# Patient Record
Sex: Female | Born: 1965 | Race: White | Hispanic: No | Marital: Married
Health system: Southern US, Community
[De-identification: ages and names within clinical notes are randomized; demographics above are authoritative.]

## PROBLEM LIST (undated history)

## (undated) DIAGNOSIS — F319 Bipolar disorder, unspecified: Secondary | ICD-10-CM

## (undated) HISTORY — PX: KNEE SURGERY: SHX244

## (undated) HISTORY — PX: HERNIA REPAIR: SHX51

## (undated) HISTORY — PX: CHOLECYSTECTOMY: SHX55

## (undated) HISTORY — PX: TONSILLECTOMY: SUR1361

## (undated) HISTORY — PX: ABDOMINAL HYSTERECTOMY: SHX81

---

## 2008-05-14 ENCOUNTER — Emergency Department (HOSPITAL_BASED_OUTPATIENT_CLINIC_OR_DEPARTMENT_OTHER): Admission: EM | Admit: 2008-05-14 | Discharge: 2008-05-14 | Payer: Self-pay | Admitting: Emergency Medicine

## 2008-08-16 ENCOUNTER — Emergency Department (HOSPITAL_BASED_OUTPATIENT_CLINIC_OR_DEPARTMENT_OTHER): Admission: EM | Admit: 2008-08-16 | Discharge: 2008-08-16 | Payer: Self-pay | Admitting: Emergency Medicine

## 2008-08-16 ENCOUNTER — Ambulatory Visit: Payer: Self-pay | Admitting: Diagnostic Radiology

## 2008-10-14 ENCOUNTER — Ambulatory Visit: Payer: Self-pay | Admitting: Diagnostic Radiology

## 2008-10-14 ENCOUNTER — Emergency Department (HOSPITAL_BASED_OUTPATIENT_CLINIC_OR_DEPARTMENT_OTHER): Admission: EM | Admit: 2008-10-14 | Discharge: 2008-10-14 | Payer: Self-pay | Admitting: Emergency Medicine

## 2008-11-08 ENCOUNTER — Emergency Department (HOSPITAL_BASED_OUTPATIENT_CLINIC_OR_DEPARTMENT_OTHER): Admission: EM | Admit: 2008-11-08 | Discharge: 2008-11-08 | Payer: Self-pay | Admitting: Emergency Medicine

## 2008-11-08 ENCOUNTER — Ambulatory Visit: Payer: Self-pay | Admitting: Diagnostic Radiology

## 2010-07-04 LAB — URINALYSIS, ROUTINE W REFLEX MICROSCOPIC
Ketones, ur: NEGATIVE mg/dL
Nitrite: NEGATIVE
Specific Gravity, Urine: 1.023 (ref 1.005–1.030)
pH: 5.5 (ref 5.0–8.0)

## 2016-07-26 ENCOUNTER — Emergency Department (HOSPITAL_BASED_OUTPATIENT_CLINIC_OR_DEPARTMENT_OTHER): Payer: Self-pay

## 2016-07-26 ENCOUNTER — Emergency Department (HOSPITAL_BASED_OUTPATIENT_CLINIC_OR_DEPARTMENT_OTHER)
Admission: EM | Admit: 2016-07-26 | Discharge: 2016-07-26 | Disposition: A | Discharge status: Home / Self Care | Payer: Self-pay | Attending: Emergency Medicine | Admitting: Emergency Medicine

## 2016-07-26 ENCOUNTER — Encounter (HOSPITAL_BASED_OUTPATIENT_CLINIC_OR_DEPARTMENT_OTHER): Payer: Self-pay | Admitting: *Deleted

## 2016-07-26 DIAGNOSIS — M5442 Lumbago with sciatica, left side: Secondary | ICD-10-CM

## 2016-07-26 DIAGNOSIS — M545 Low back pain: Secondary | ICD-10-CM | POA: Insufficient documentation

## 2016-07-26 DIAGNOSIS — M5432 Sciatica, left side: Secondary | ICD-10-CM | POA: Insufficient documentation

## 2016-07-26 DIAGNOSIS — T148XXA Other injury of unspecified body region, initial encounter: Secondary | ICD-10-CM

## 2016-07-26 HISTORY — DX: Bipolar disorder, unspecified: F31.9

## 2016-07-26 MED ORDER — HYDROMORPHONE HCL 1 MG/ML IJ SOLN
1.0000 mg | Freq: Once | INTRAMUSCULAR | Status: AC
  Administered 2016-07-26: 1 mg via INTRAMUSCULAR
  Filled 2016-07-26: qty 1

## 2016-07-26 MED ORDER — METHOCARBAMOL 500 MG PO TABS: 500.0000 mg | ORAL_TABLET | Freq: Two times a day (BID) | ORAL | 0 refills | Status: AC

## 2016-07-26 MED ORDER — PREDNISONE 50 MG PO TABS
60.0000 mg | ORAL_TABLET | Freq: Once | ORAL | Status: AC
  Administered 2016-07-26: 60 mg via ORAL
  Filled 2016-07-26: qty 1

## 2016-07-26 MED ORDER — HYDROCODONE-ACETAMINOPHEN 5-325 MG PO TABS: 1.0000 | ORAL_TABLET | ORAL | 0 refills | Status: AC | PRN

## 2016-07-26 MED ORDER — DIAZEPAM 5 MG PO TABS
5.0000 mg | ORAL_TABLET | Freq: Once | ORAL | Status: AC
  Administered 2016-07-26: 5 mg via ORAL
  Filled 2016-07-26: qty 1

## 2016-07-26 MED ORDER — OXYCODONE-ACETAMINOPHEN 5-325 MG PO TABS
1.0000 | ORAL_TABLET | Freq: Once | ORAL | Status: AC
  Administered 2016-07-26: 1 via ORAL
  Filled 2016-07-26: qty 1

## 2016-07-26 MED ORDER — PREDNISONE 20 MG PO TABS: 40.0000 mg | ORAL_TABLET | Freq: Every day | ORAL | 0 refills | Status: AC

## 2016-07-26 NOTE — ED Triage Notes (Signed)
Patient is alert and oriented x4.  She is complaining of lower back pain after attempting to pick up her granddaughter.  Patient states that she felt pop in her lower back.  Currently she rates her pain 10 of 10.

## 2016-07-26 NOTE — ED Notes (Signed)
ED Provider at bedside. 

## 2016-07-26 NOTE — Discharge Instructions (Signed)
Take your medications as prescribed and recommend refrain from doing any heavy lifting, repetitive movements or squatting that exacerbates her symptoms for the next few days. Follow-up with your primary care provider within the next 4-5 days for follow-up evaluation of your pain has not improved. Please return to the Emergency Department if symptoms worsen or new onset of fever, numbness, tingling, groin anesthesia, loss of bowel or bladder, weakness, chest pain, difficulty breathing, abdominal pain.

## 2016-07-26 NOTE — ED Notes (Signed)
Patient ambulated in hall slow but steady gait, says she is sore but ok.

## 2016-07-26 NOTE — ED Provider Notes (Signed)
[facility] Provider Note   CSN: [id number] Arrival date & time: 07/26/16  1604  By signing my name below, I, [name], attest that this documentation has been prepared under the direction and in the presence of Melburn Hake, PA-C. Electronically Signed: [name], Scribe. 07/26/16. 4:30 PM.  History   Chief Complaint Chief Complaint  Patient presents with  . Back Pain    HPI Tamara Torres is a 51 y.o. female with h/o scoliosis, degenerative disk disease who presents to the Emergency Department complaining of sharp, stabbing, aching, 10/10 severity back pain s/p picking up granddaughter, who weighs 33 lbs earlier today. Pt was standing, bent over, and then attempted to lift up her granddaughter and describes feeling a pop in her left lower back. The pain radiates across her lower back and intermittently down both legs. Pt did not take any pain mediation PTA. Pt has associated intermittent tingling in her toes. Pt is able to ambulate with assistance but reports pain. 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 Pt denies having chemo/radiation. Reports no hx of cancer/recurrence s/p surgery. Pt has no h/o IV drug use. Pt denies fever, fall, LOC, head injury, abdominal pain, CP, SOB, N/V, urinary or bowel incontinence, saddle anesthesia, numbness, weakness. Pt is allergic to ibuprofen and reacts with itchiness and hives. Pt is also allergic to penicillin.   The history is provided by the patient. No language interpreter was used.    Past Medical History:  Diagnosis Date  . Bipolar 1 disorder (HCC)     There are no active problems to display for this patient.   Past Surgical History:  Procedure Laterality Date  . ABDOMINAL HYSTERECTOMY    . CHOLECYSTECTOMY    . HERNIA REPAIR    . KNEE SURGERY    . TONSILLECTOMY      OB History    No data available       Home Medications    Prior to Admission  medications   Medication Sig Start Date End Date Taking? Authorizing Provider  HYDROcodone-acetaminophen (NORCO/VICODIN) 5-325 MG tablet Take 1 tablet by mouth every 4 (four) hours as needed. 07/26/16   Barrett Henle, PA-C  methocarbamol (ROBAXIN) 500 MG tablet Take 1 tablet (500 mg total) by mouth 2 (two) times daily. 07/26/16   Barrett Henle, PA-C  predniSONE (DELTASONE) 20 MG tablet Take 2 tablets (40 mg total) by mouth daily. 07/26/16   Barrett Henle, PA-C    Family History No family history on file.  Social History Social History  Substance Use Topics  . Smoking status: Not on file  . Smokeless tobacco: Not on file  . Alcohol use Not on file     Allergies   Penicillins; Keflex [cephalexin]; Fentanyl; and Ibuprofen   Review of Systems Review of Systems  Constitutional: Negative for fever.  Respiratory: Negative for shortness of breath.   Cardiovascular: Negative for chest pain.  Gastrointestinal: Negative for abdominal pain and diarrhea.  Musculoskeletal: Positive for back pain.  All other systems reviewed and are negative.    Physical Exam Updated Vital Signs BP (!) 95/59 (BP Location: Right Arm)   Pulse 79   Temp 98.6 F (37 C) (Oral)   Resp 18   SpO2 97%   Physical Exam  Constitutional: She is oriented to person, place, and time. She appears well-developed and well-nourished.  HENT:  Head: Normocephalic and atraumatic.  Eyes: Conjunctivae and EOM  are normal. Right eye exhibits no discharge. Left eye exhibits no discharge. No scleral icterus.  Neck: Normal range of motion. Neck supple.  Cardiovascular: Normal rate, regular rhythm, normal heart sounds and intact distal pulses.   Pulmonary/Chest: Effort normal and breath sounds normal. No respiratory distress. She has no wheezes. She has no rales. She exhibits no tenderness.  Abdominal: Soft. Bowel sounds are normal. She exhibits no distension and no mass. There is no tenderness.  There is no rebound and no guarding. No hernia.  Musculoskeletal: She exhibits tenderness. She exhibits no edema or deformity.  No midline C, T tenderness. TTP over midline lumbar spine and bilateral lumbar paraspinal muscles. Full range of motion of neck and decrease ROM to back due to pain. Full range of motion of bilateral upper and lower extremities, with 5/5 strength. Sensation intact. 2+ radial and PT pulses. Cap refill <2 seconds. Patient able to stand and ambulate without assistance. Positive left straight leg raise.  Neurological: She is alert and oriented to person, place, and time. She has normal strength. She displays normal reflexes. No sensory deficit.  Skin: Skin is warm and dry.  Psychiatric: She has a normal mood and affect.  Nursing note and vitals reviewed.    ED Treatments / Results   DIAGNOSTIC STUDIES: Oxygen Saturation is 98% on room air, normal by my interpretation.    COORDINATION OF CARE: 4:23 PM Discussed treatment plan with pt at bedside and pt agreed to plan.    Labs (all labs ordered are listed, but only abnormal results are displayed) Labs Reviewed - No data to display  EKG  EKG Interpretation None       Radiology Dg Lumbar Spine Complete  Result Date: 07/26/2016 CLINICAL DATA:  Tingling of the feet after possible trauma. EXAM: LUMBAR SPINE - COMPLETE 4+ VIEW COMPARISON:  11/08/2008 FINDINGS: There is no evidence of lumbar spine fracture. Alignment is normal. Mild osteoarthritic changes of the lower lumbosacral spine with posterior facet arthropathy. 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 IMPRESSION: No evidence of acute traumatic injury to the lumbosacral spine. Multilevel osteoarthritic changes of the lower lumbosacral spine. Electronically Signed   By: Ted Mcalpineobrinka  Dimitrova M.D.   On: 07/26/2016 17:26    Procedures Procedures (including critical care time)  Medications Ordered in ED Medications  oxyCODONE-acetaminophen  (PERCOCET/ROXICET) 5-325 MG per tablet 1 tablet (1 tablet Oral Given 07/26/16 1640)  diazepam (VALIUM) tablet 5 mg (5 mg Oral Given 07/26/16 1640)  HYDROmorphone (DILAUDID) injection 1 mg (1 mg Intramuscular Given 07/26/16 1745)  predniSONE (DELTASONE) tablet 60 mg (60 mg Oral Given 07/26/16 1747)     Initial Impression / Assessment and Plan / ED Course  I have reviewed the triage vital signs and the nursing notes.  Pertinent labs & imaging results that were available during my care of the patient were reviewed by me and considered in my medical decision making (see chart for details).    Patient presents with sudden onset left lower back pain that intermittently radiates across her lower back and into bilateral legs after she was lifting her granddaughter earlier this afternoon. Denies any recent fall or other injury. No back pain red flags. VSS. Exam revealed tenderness over midline lumbar spine and bilateral lumbar paraspinal muscles. Positive left straight leg raise. Bilateral lower extremities neurovascular intact. Patellar reflexes intact. Patient given pain meds and muscle relaxant in ED. Lumbar spine x-ray revealed no evidence of acute injury, multilevel osteoarthritic changes present to lower lumbosacral spine. Reevaluation  patient reports improvement of pain. Patient able to stand and ambulate in the ED. No concern for cauda equina, epidural abscess or spinal cord compression warranting further imaging at this time. Suspect lumbar strain with left sided sciatica. Plan to d/c home with RICE protocol, pain meds, muscle relaxant and prednisone. Advised patient to follow up with PCP within the next 4-5 days for follow-up evaluation. Discussed return precautions.  [place] Controlled Substance reporting System queried. Pt's most recent prescription filled for narcotics was Vicodin 10-325mg  #100 on 06/13/16.   Final Clinical Impressions(s) / ED Diagnoses   Final diagnoses:  Acute left-sided  low back pain with left-sided sciatica  Muscle strain    New Prescriptions New Prescriptions   HYDROCODONE-ACETAMINOPHEN (NORCO/VICODIN) 5-325 MG TABLET    Take 1 tablet by mouth every 4 (four) hours as needed.   METHOCARBAMOL (ROBAXIN) 500 MG TABLET    Take 1 tablet (500 mg total) by mouth 2 (two) times daily.   PREDNISONE (DELTASONE) 20 MG TABLET    Take 2 tablets (40 mg total) by mouth daily.   I personally performed the services described in this documentation, which was scribed in my presence. The recorded information has been reviewed and is accurate.     Barrett Henle, PA-C 07/26/16 1830    Gwyneth Sprout, MD 07/26/16 2146

## 2016-10-17 DEATH — deceased

## 2018-01-21 IMAGING — CR DG LUMBAR SPINE COMPLETE 4+V
5 series · 5 of 5 positions shown · non-contrast
Comparison: 11/08/2008

CLINICAL DATA: Tingling of the feet after possible trauma.

EXAM:
LUMBAR SPINE - COMPLETE 4+ VIEW

[t l-spine a.p.]
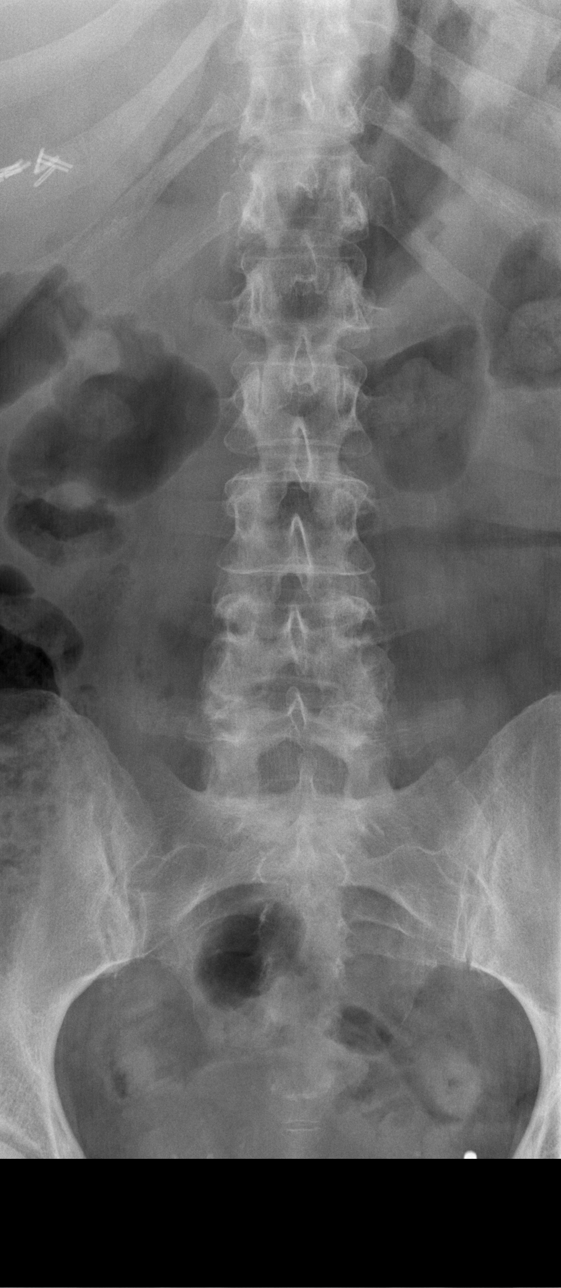

[t l-spine oblique exposure]
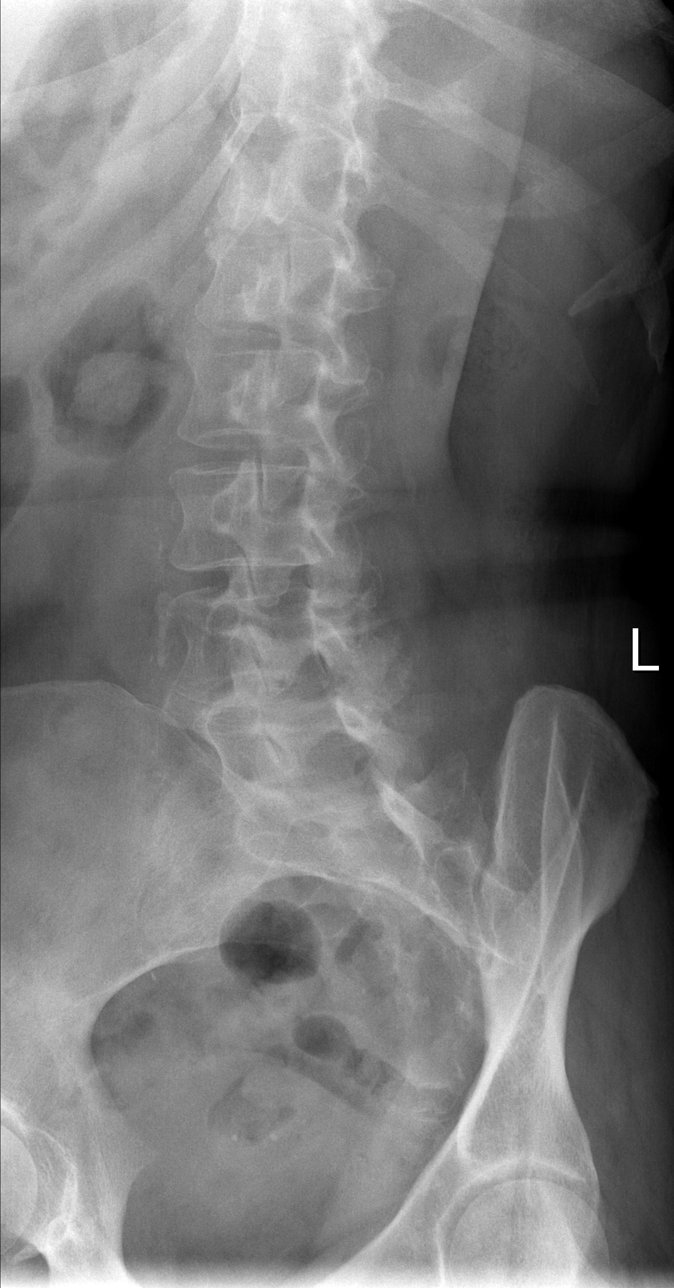

[t l-spine oblique exposure *]
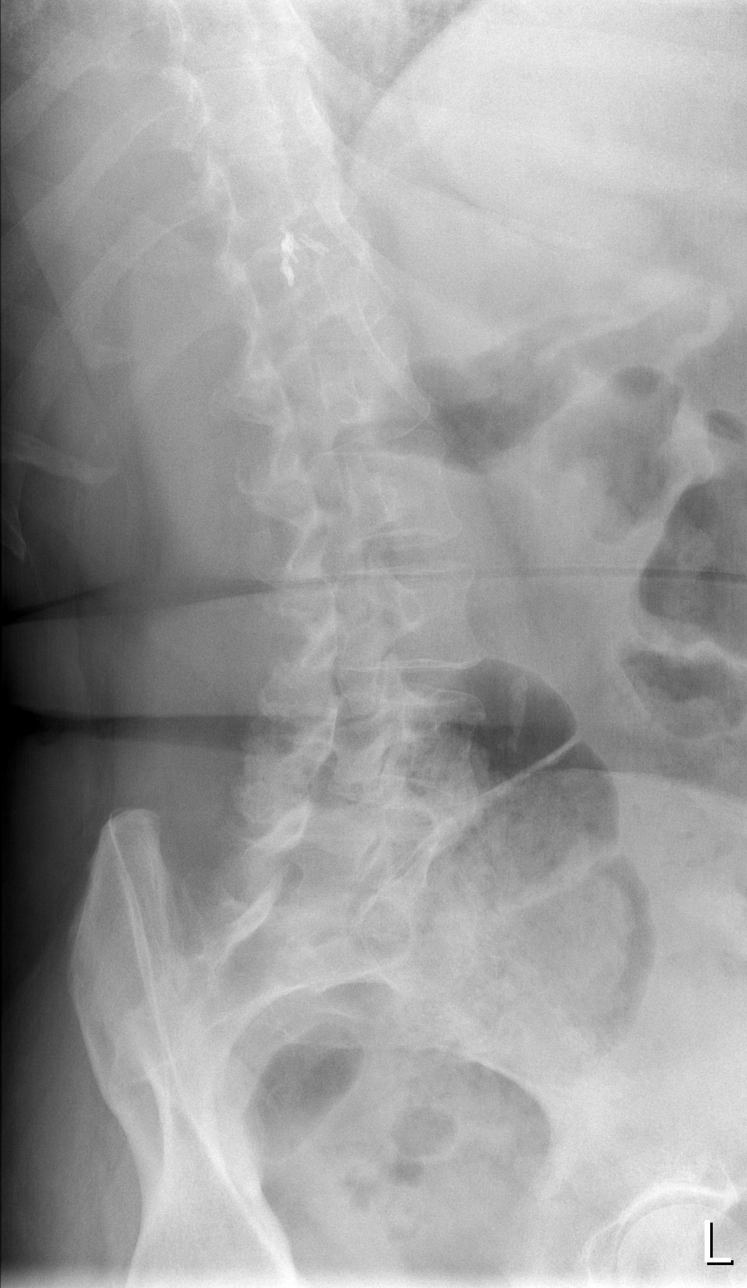

[t l-spine lat]
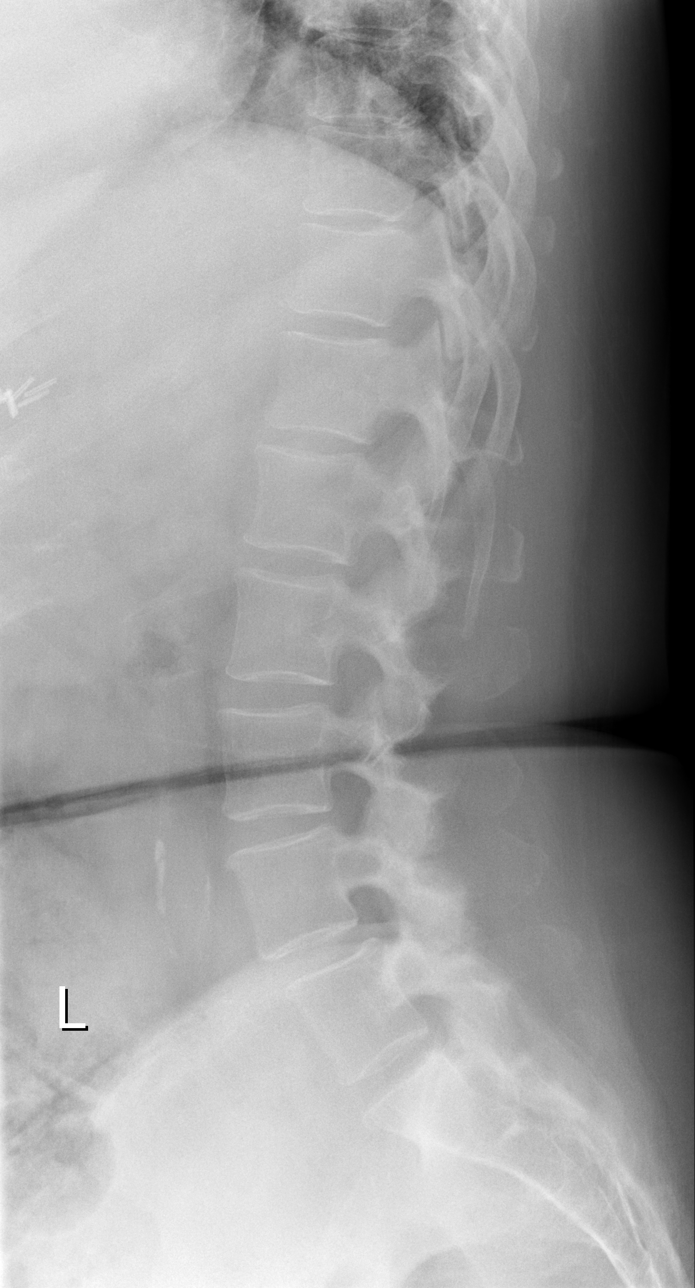

[t l-spine l5-s1 spot]
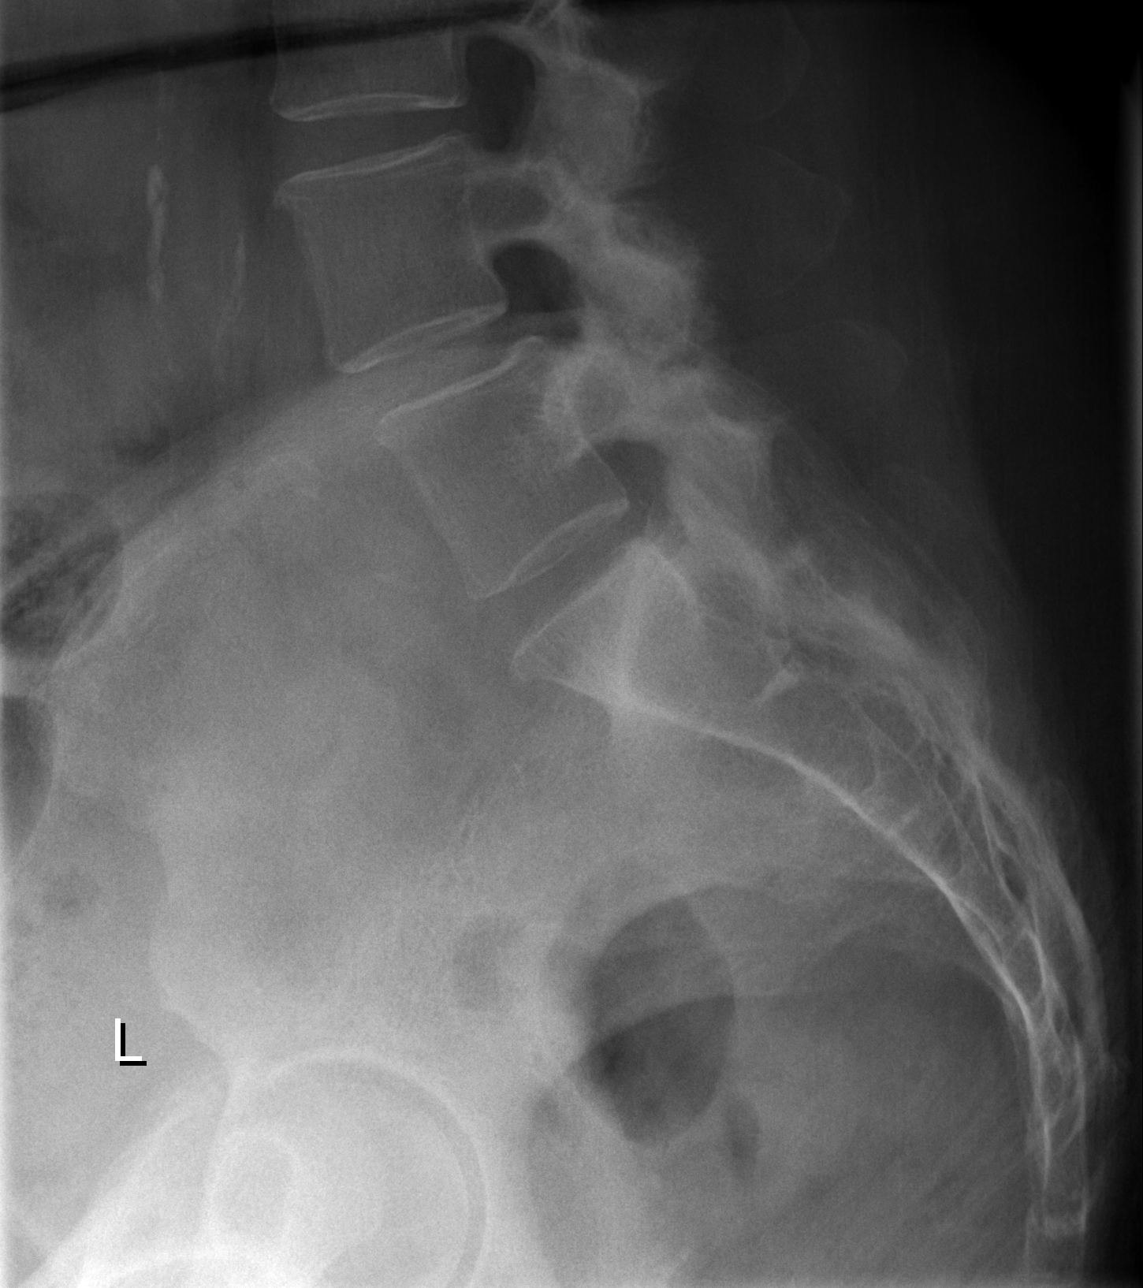

[5 of 5 positions shown; findings below may reference images not displayed]

FINDINGS: There is no evidence of lumbar spine fracture. Alignment is normal.
Mild osteoarthritic changes of the lower lumbosacral spine with
posterior facet arthropathy.

Calcific atherosclerotic disease of the distal abdominal aorta.
IMPRESSION: No evidence of acute traumatic injury to the lumbosacral spine.

Multilevel osteoarthritic changes of the lower lumbosacral spine.
